# Patient Record
Sex: Female | Born: 2000 | Race: Black or African American | Hispanic: No | Marital: Single | Smoking: Never smoker
Health system: Southern US, Community
[De-identification: ages and names within clinical notes are randomized; demographics above are authoritative.]

## PROBLEM LIST (undated history)

## (undated) DIAGNOSIS — R519 Headache, unspecified: Secondary | ICD-10-CM

## (undated) DIAGNOSIS — R51 Headache: Secondary | ICD-10-CM

## (undated) HISTORY — DX: Headache, unspecified: R51.9

## (undated) HISTORY — DX: Headache: R51

---
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
## 2021-01-05 ENCOUNTER — Encounter (INDEPENDENT_AMBULATORY_CARE_PROVIDER_SITE_OTHER): Payer: Self-pay
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
## 2021-12-06 ENCOUNTER — Other Ambulatory Visit (HOSPITAL_COMMUNITY)
Admission: RE | Admit: 2021-12-06 | Discharge: 2021-12-06 | Disposition: A | Discharge status: Home / Self Care | Payer: Medicaid Other | Source: Ambulatory Visit | Attending: Pediatrics | Admitting: Pediatrics

## 2021-12-06 ENCOUNTER — Ambulatory Visit (INDEPENDENT_AMBULATORY_CARE_PROVIDER_SITE_OTHER): Payer: Medicaid Other | Admitting: Pediatrics

## 2021-12-06 ENCOUNTER — Encounter: Payer: Self-pay | Admitting: Pediatrics

## 2021-12-06 VITALS — BP 124/78 | HR 96 | Ht 64.49 in | Wt 214.6 lb

## 2021-12-06 DIAGNOSIS — G43109 Migraine with aura, not intractable, without status migrainosus: Secondary | ICD-10-CM | POA: Diagnosis not present

## 2021-12-06 DIAGNOSIS — Z0001 Encounter for general adult medical examination with abnormal findings: Secondary | ICD-10-CM | POA: Diagnosis not present

## 2021-12-06 DIAGNOSIS — R634 Abnormal weight loss: Secondary | ICD-10-CM | POA: Diagnosis not present

## 2021-12-06 DIAGNOSIS — R630 Anorexia: Secondary | ICD-10-CM | POA: Diagnosis not present

## 2021-12-06 DIAGNOSIS — Z113 Encounter for screening for infections with a predominantly sexual mode of transmission: Secondary | ICD-10-CM

## 2021-12-06 DIAGNOSIS — A749 Chlamydial infection, unspecified: Secondary | ICD-10-CM | POA: Diagnosis not present

## 2021-12-06 DIAGNOSIS — Z68.41 Body mass index (BMI) pediatric, greater than or equal to 95th percentile for age: Secondary | ICD-10-CM

## 2021-12-06 DIAGNOSIS — L700 Acne vulgaris: Secondary | ICD-10-CM | POA: Diagnosis not present

## 2021-12-06 DIAGNOSIS — Z114 Encounter for screening for human immunodeficiency virus [HIV]: Secondary | ICD-10-CM | POA: Diagnosis not present

## 2021-12-06 LAB — POCT RAPID HIV: Rapid HIV, POC: NEGATIVE

## 2021-12-06 MED ORDER — ADAPALENE 0.1 % EX CREA: TOPICAL_CREAM | Freq: Every day | CUTANEOUS | 0 refills | Status: AC

## 2021-12-06 NOTE — Progress Notes (Signed)
Adolescent Well Care Visit ?Sheila Thornton is a 21 y.o. female who is here for well care. ?   ?PCP:  Ancil Linsey, MD ? ? History was provided by the patient. ? ?Confidentiality was discussed with the patient and, if applicable, with caregiver as well. ?Patient's personal or confidential phone number:  ? ? ?Current Issues: ?Current concerns include  ?Loss of appetite- nauseous when eating . Still restricting to try to lose weight  ? ?Nutrition: ?Nutrition/Eating Behaviors: still restricting meals; eats once per  ?Adequate calcium in diet?: loves yogurt  ?Supplements/ Vitamins: none ? ?Exercise/ Media: ?Play any Sports?/ Exercise: has been to gym recently  ? ?Sleep:  ?Sleep: sleeping well without any issues  ? ?Social Screening: ?Lives with:  mom  ?Parental relations:  good ?Activities, Work, and Regulatory affairs officer?: works at [employer]  ?Concerns regarding behavior with peers?  no ?Stressors of note: no ? ?Education: ?School Name: has not yet decided on college  ? ?Menstruation:   ?No LMP recorded. ?Menstrual History: has regular periods monthly that are not heavy   ? ?Confidential Social History: ?Tobacco?  no ?Secondhand smoke exposure?  no ?Drugs/ETOH?  no ? ?Sexually Active?  no  - not currently; had a previous partner but did not always use protection  ?Pregnancy Prevention: none  ? ?Safe at home, in school & in relationships?  Yes ?Safe to self?  Yes  ? ?Screenings: ?Patient has a dental home: yes ? ?The patient completed the Rapid Assessment for Adolescent Preventive Services screening questionnaire and the following topics were identified as risk factors and discussed: healthy eating, exercise, and mental health issues  ?In addition, the following topics were discussed as part of anticipatory guidance birth control ? ?Physical Exam:  ?Vitals:  ? 12/06/21 0929  ?BP: 124/78  ?Pulse: 96  ?SpO2: 99%  ?Weight: 214 lb 9.6 oz (97.3 kg)  ?Height: 5' 4.49" (1.638 m)  ? ?Wt Readings from Last 3 Encounters:   ?12/06/21 214 lb 9.6 oz (97.3 kg) (98 %, Z= 2.14)*  ?11/26/20 232 lb 3.2 oz (105.3 kg) (99 %, Z= 2.32)*  ?11/19/20 233 lb (105.7 kg) (99 %, Z= 2.32)*  ? ?* Growth percentiles are based on CDC (Girls, 2-20 Years) data.  ? ? ?BP 124/78 (BP Location: Right Arm, Patient Position: Sitting)   Pulse 96   Ht 5' 4.49" (1.638 m)   Wt 214 lb 9.6 oz (97.3 kg)   SpO2 99%   BMI 36.28 kg/m?  ?Body mass index: body mass index is 36.28 kg/m?. ?Blood pressure percentiles are not available for patients who are 18 years or older. ? ?Hearing Screening  ? 500Hz  1000Hz  2000Hz  4000Hz   ?Right ear 20 20 20 20   ?Left ear 20 20 20 20   ? ?Vision Screening  ? Right eye Left eye Both eyes  ?Without correction 20/20 20/20 20/20   ?With correction     ? ? ?General Appearance:   alert, oriented, no acute distress and well nourished  ?HENT: Normocephalic, no obvious abnormality, conjunctiva clear  ?Mouth:   Normal appearing teeth, no obvious discoloration, dental caries, or dental caps  ?Neck:   Supple; thyroid: no enlargement, symmetric, no tenderness/mass/nodules  ?Chest No anterior chest wall abnormality   ?Lungs:   Clear to auscultation bilaterally, normal work of breathing  ?Heart:   Regular rate and rhythm, S1 and S2 normal, no murmurs;   ?Abdomen:   Soft, non-tender, no mass, or organomegaly  ?GU genitalia not examined  ?Musculoskeletal:   Tone and  strength strong and symmetrical, all extremities             ?  ?Lymphatic:   No cervical adenopathy  ?Skin/Hair/Nails:   Skin warm, dry and intact, no rashes, no bruises or petechiae  ?Neurologic:   Strength, gait, and coordination normal and age-appropriate  ? ? ? ?Assessment and Plan:  ? ?Aisley is a 21 yo F here for well adolescent visit.  ? ?BMI is not appropriate for age but improving! Significant weight loss today. Discussed healthier weight management options.  Increase protein intake and decrease sugar and carbs.  Discussed exercise 15 min for 3 days per week and not missing meals.   ? ?Hearing screening result:normal ?Vision screening result: normal ? ?Counseling provided for all of the vaccine components  ?Orders Placed This Encounter  ?Procedures  ? CBC with Differential/Platelet  ? Comprehensive metabolic panel  ? Hemoglobin A1c  ? Lipid panel  ? VITAMIN D 25 Hydroxy (Vit-D Deficiency, Fractures)  ? Iron, TIBC and Ferritin Panel  ? Ambulatory referral to Family Practice  ? POCT Rapid HIV  ? ? 1. Routine screening for STI (sexually transmitted infection) ? ?- Urine cytology ancillary only ?- POCT Rapid HIV ? ?2. Encounter for general adult medical examination with abnormal findings ? ?- Ambulatory referral to Family Practice ? ?3. Body mass index, pediatric, greater than or equal to 95th percentile for age ? ?- CBC with Differential/Platelet ?- Comprehensive metabolic panel ?- Hemoglobin A1c ?- Lipid panel ?- VITAMIN D 25 Hydroxy (Vit-D Deficiency, Fractures) ?- Iron, TIBC and Ferritin Panel ? ?4. Loss of appetite ? ?- CBC with Differential/Platelet ?- Comprehensive metabolic panel ?- Hemoglobin A1c ?- Lipid panel ?- VITAMIN D 25 Hydroxy (Vit-D Deficiency, Fractures) ?- Iron, TIBC and Ferritin Panel ? ?5. Weight loss ? ? ?6. Acne vulgaris ? ?- adapalene (DIFFERIN) 0.1 % cream; Apply topically at bedtime.  Dispense: 45 g; Refill: 0 ? ?7. Migraine with aura and without status migrainosus, not intractable ?Stable with ibuprofen ?Has nausea with imitrex ? ?Return in 1 year (on 12/07/2022) for well child with PCP.Marland Kitchen ? ?Ancil Linsey, MD ? ? ? ?

## 2021-12-07 ENCOUNTER — Other Ambulatory Visit: Payer: Self-pay | Admitting: Pediatrics

## 2021-12-07 DIAGNOSIS — E559 Vitamin D deficiency, unspecified: Secondary | ICD-10-CM

## 2021-12-07 LAB — CBC WITH DIFFERENTIAL/PLATELET
Absolute Monocytes: 518 cells/uL (ref 200–950)
Basophils Absolute: 43 cells/uL (ref 0–200)
Basophils Relative: 0.6 %
Eosinophils Absolute: 163 cells/uL (ref 15–500)
Eosinophils Relative: 2.3 %
HCT: 41.7 % (ref 35.0–45.0)
Hemoglobin: 13.7 g/dL (ref 11.7–15.5)
Lymphs Abs: 1555 cells/uL (ref 850–3900)
MCH: 29.9 pg (ref 27.0–33.0)
MCHC: 32.9 g/dL (ref 32.0–36.0)
MCV: 91 fL (ref 80.0–100.0)
MPV: 11 fL (ref 7.5–12.5)
Monocytes Relative: 7.3 %
Neutro Abs: 4821 cells/uL (ref 1500–7800)
Neutrophils Relative %: 67.9 %
Platelets: 301 10*3/uL (ref 140–400)
RBC: 4.58 10*6/uL (ref 3.80–5.10)
RDW: 13.1 % (ref 11.0–15.0)
Total Lymphocyte: 21.9 %
WBC: 7.1 10*3/uL (ref 3.8–10.8)

## 2021-12-07 LAB — COMPREHENSIVE METABOLIC PANEL
AG Ratio: 1.4 (calc) (ref 1.0–2.5)
ALT: 18 U/L (ref 5–32)
AST: 35 U/L — ABNORMAL HIGH (ref 12–32)
Albumin: 4.2 g/dL (ref 3.6–5.1)
Alkaline phosphatase (APISO): 66 U/L (ref 36–128)
BUN: 11 mg/dL (ref 7–20)
CO2: 23 mmol/L (ref 20–32)
Calcium: 9.4 mg/dL (ref 8.9–10.4)
Chloride: 107 mmol/L (ref 98–110)
Creat: 0.86 mg/dL (ref 0.50–0.96)
Globulin: 2.9 g/dL (calc) (ref 2.0–3.8)
Glucose, Bld: 87 mg/dL (ref 65–139)
Potassium: 4.3 mmol/L (ref 3.8–5.1)
Sodium: 139 mmol/L (ref 135–146)
Total Bilirubin: 0.3 mg/dL (ref 0.2–1.1)
Total Protein: 7.1 g/dL (ref 6.3–8.2)

## 2021-12-07 LAB — VITAMIN D 25 HYDROXY (VIT D DEFICIENCY, FRACTURES): Vit D, 25-Hydroxy: 13 ng/mL — ABNORMAL LOW (ref 30–100)

## 2021-12-07 LAB — HEMOGLOBIN A1C
Hgb A1c MFr Bld: 5.1 % of total Hgb (ref <5.7)
Mean Plasma Glucose: 100 mg/dL
eAG (mmol/L): 5.5 mmol/L

## 2021-12-07 LAB — URINE CYTOLOGY ANCILLARY ONLY
Chlamydia: POSITIVE — AB
Comment: NEGATIVE
Comment: NORMAL
Neisseria Gonorrhea: NEGATIVE

## 2021-12-07 LAB — IRON,TIBC AND FERRITIN PANEL
%SAT: 17 % (calc) (ref 15–45)
Ferritin: 22 ng/mL (ref 16–154)
Iron: 66 ug/dL (ref 27–164)
TIBC: 383 mcg/dL (calc) (ref 271–448)

## 2021-12-07 LAB — LIPID PANEL
Cholesterol: 162 mg/dL (ref <170)
HDL: 46 mg/dL (ref >45)
LDL Cholesterol (Calc): 94 mg/dL (calc) (ref <110)
Non-HDL Cholesterol (Calc): 116 mg/dL (calc) (ref <120)
Total CHOL/HDL Ratio: 3.5 (calc) (ref <5.0)
Triglycerides: 127 mg/dL — ABNORMAL HIGH (ref <90)

## 2021-12-07 MED ORDER — DOXYCYCLINE HYCLATE 100 MG PO TBEC: 100.0000 mg | DELAYED_RELEASE_TABLET | Freq: Two times a day (BID) | ORAL | 0 refills | Status: AC

## 2021-12-07 NOTE — Addendum Note (Signed)
Addended by: Ancil Linsey on: 12/07/2021 03:32 PM ? ? Modules accepted: Orders ? ?

## 2022-01-22 ENCOUNTER — Telehealth: Payer: Self-pay | Admitting: Pediatrics

## 2022-01-22 NOTE — Telephone Encounter (Signed)
Patient requesting call back . States she is needs to have a prescription sent for Vitamin D . Call back number is [phone]

## 2022-01-23 NOTE — Telephone Encounter (Signed)
I spoke with [facility] and relayed message from Dr. Kennedy Bucker.
